# Patient Record
Sex: Male | Born: 2000 | Race: Black or African American | Hispanic: No | Marital: Single | Smoking: Never smoker
Health system: Southern US, Community
[De-identification: ages and names within clinical notes are randomized; demographics above are authoritative.]

## PROBLEM LIST (undated history)

## (undated) DIAGNOSIS — J45909 Unspecified asthma, uncomplicated: Secondary | ICD-10-CM

## (undated) HISTORY — PX: TONSILLECTOMY: SUR1361

## (undated) HISTORY — PX: MYRINGOTOMY WITH TUBE PLACEMENT: SHX5663

---
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
## 2021-09-11 ENCOUNTER — Other Ambulatory Visit: Payer: Self-pay

## 2021-09-11 ENCOUNTER — Emergency Department
Admission: EM | Admit: 2021-09-11 | Discharge: 2021-09-11 | Disposition: A | Discharge status: Home / Self Care | Payer: Medicaid Other | Attending: Emergency Medicine | Admitting: Emergency Medicine

## 2021-09-11 ENCOUNTER — Encounter: Payer: Self-pay | Admitting: Emergency Medicine

## 2021-09-11 ENCOUNTER — Emergency Department: Payer: Medicaid Other

## 2021-09-11 DIAGNOSIS — J4531 Mild persistent asthma with (acute) exacerbation: Secondary | ICD-10-CM | POA: Insufficient documentation

## 2021-09-11 DIAGNOSIS — R079 Chest pain, unspecified: Secondary | ICD-10-CM

## 2021-09-11 DIAGNOSIS — J4521 Mild intermittent asthma with (acute) exacerbation: Secondary | ICD-10-CM

## 2021-09-11 DIAGNOSIS — Z20822 Contact with and (suspected) exposure to covid-19: Secondary | ICD-10-CM | POA: Diagnosis not present

## 2021-09-11 DIAGNOSIS — R0789 Other chest pain: Secondary | ICD-10-CM | POA: Diagnosis present

## 2021-09-11 LAB — CBC WITH DIFFERENTIAL/PLATELET
Abs Immature Granulocytes: 0.04 10*3/uL (ref 0.00–0.07)
Basophils Absolute: 0 10*3/uL (ref 0.0–0.1)
Basophils Relative: 0 %
Eosinophils Absolute: 0.2 10*3/uL (ref 0.0–0.5)
Eosinophils Relative: 2 %
HCT: 44.6 % (ref 39.0–52.0)
Hemoglobin: 14.1 g/dL (ref 13.0–17.0)
Immature Granulocytes: 0 %
Lymphocytes Relative: 37 %
Lymphs Abs: 4.8 10*3/uL — ABNORMAL HIGH (ref 0.7–4.0)
MCH: 26.3 pg (ref 26.0–34.0)
MCHC: 31.6 g/dL (ref 30.0–36.0)
MCV: 83.1 fL (ref 80.0–100.0)
Monocytes Absolute: 0.7 10*3/uL (ref 0.1–1.0)
Monocytes Relative: 6 %
Neutro Abs: 7.1 10*3/uL (ref 1.7–7.7)
Neutrophils Relative %: 55 %
Platelets: 248 10*3/uL (ref 150–400)
RBC: 5.37 MIL/uL (ref 4.22–5.81)
RDW: 14.4 % (ref 11.5–15.5)
WBC: 12.9 10*3/uL — ABNORMAL HIGH (ref 4.0–10.5)
nRBC: 0.2 % (ref 0.0–0.2)

## 2021-09-11 LAB — COMPREHENSIVE METABOLIC PANEL
ALT: 27 U/L (ref 0–44)
AST: 25 U/L (ref 15–41)
Albumin: 4.4 g/dL (ref 3.5–5.0)
Alkaline Phosphatase: 120 U/L (ref 38–126)
Anion gap: 5 (ref 5–15)
BUN: 13 mg/dL (ref 6–20)
CO2: 28 mmol/L (ref 22–32)
Calcium: 9 mg/dL (ref 8.9–10.3)
Chloride: 109 mmol/L (ref 98–111)
Creatinine, Ser: 0.94 mg/dL (ref 0.61–1.24)
GFR, Estimated: >60 mL/min (ref >60)
Glucose, Bld: 112 mg/dL — ABNORMAL HIGH (ref 70–99)
Potassium: 3.6 mmol/L (ref 3.5–5.1)
Sodium: 142 mmol/L (ref 135–145)
Total Bilirubin: 0.6 mg/dL (ref 0.3–1.2)
Total Protein: 7.1 g/dL (ref 6.5–8.1)

## 2021-09-11 LAB — TROPONIN I (HIGH SENSITIVITY)
Troponin I (High Sensitivity): 5 ng/L (ref <18)
Troponin I (High Sensitivity): 4 ng/L (ref <18)

## 2021-09-11 LAB — RESP PANEL BY RT-PCR (FLU A&B, COVID) ARPGX2
Influenza A by PCR: NEGATIVE
Influenza B by PCR: NEGATIVE
SARS Coronavirus 2 by RT PCR: NEGATIVE

## 2021-09-11 LAB — D-DIMER, QUANTITATIVE: D-Dimer, Quant: 0.65 ug/mL-FEU — ABNORMAL HIGH (ref 0.00–0.50)

## 2021-09-11 MED ORDER — PREDNISONE 20 MG PO TABS: ORAL_TABLET | ORAL | 0 refills | Status: DC

## 2021-09-11 MED ORDER — IOHEXOL 350 MG/ML SOLN
75.0000 mL | Freq: Once | INTRAVENOUS | Status: AC | PRN
  Administered 2021-09-11: 75 mL via INTRAVENOUS

## 2021-09-11 MED ORDER — ALBUTEROL SULFATE HFA 108 (90 BASE) MCG/ACT IN AERS: 2.0000 | INHALATION_SPRAY | RESPIRATORY_TRACT | 2 refills | Status: DC | PRN

## 2021-09-11 MED ORDER — PREDNISONE 20 MG PO TABS
60.0000 mg | ORAL_TABLET | Freq: Once | ORAL | Status: AC
  Administered 2021-09-11: 60 mg via ORAL
  Filled 2021-09-11: qty 3

## 2021-09-11 MED ORDER — SODIUM CHLORIDE 0.9 % IV BOLUS
500.0000 mL | Freq: Once | INTRAVENOUS | Status: AC
  Administered 2021-09-11: 500 mL via INTRAVENOUS

## 2021-09-11 MED ORDER — IPRATROPIUM-ALBUTEROL 0.5-2.5 (3) MG/3ML IN SOLN
3.0000 mL | Freq: Once | RESPIRATORY_TRACT | Status: AC
  Administered 2021-09-11: 3 mL via RESPIRATORY_TRACT
  Filled 2021-09-11: qty 3

## 2021-09-11 NOTE — ED Triage Notes (Signed)
Patient ambulatory to triage with steady gait, without difficulty or distress noted; pt reports mid CP, nonradiating accomp by [facility]; denies any hx of same

## 2021-09-11 NOTE — Discharge Instructions (Signed)
1.  Take Prednisone 60mg  daily x4 days.  Take the next dose Thursday morning. 2.  Use your Albuterol inhaler every 4 hours as needed for difficulty breathing. 3.  Return to the ER for worsening symptoms, persistent vomiting, difficulty breathing or other concerns

## 2021-09-11 NOTE — ED Provider Notes (Addendum)
[facility] Emergency Department Provider Note   ____________________________________________   Event Date/Time   First MD Initiated Contact with Patient 09/11/21 [phone]     (approximate)  I have reviewed the triage vital signs and the nursing notes.   HISTORY  Chief Complaint Chest Pain    HPI Christopher Manning is a 20 y.o. male who presents to the ED from home with a chief complaint of mid chest tightness, nonradiating accompanied by shortness of breath.  Patient with a history of asthma; had cold-like symptoms last week.  This week has noted chest tightness with shortness of breath.  Denies fever, associated diaphoresis, palpitations, abdominal pain, nausea/vomiting or dizziness.  Trip to [place] back in July.  Denies recent trauma or hormone use.  Does admit to vaping.  Patient is vaccinated against COVID-19.     Past Medical History:  Diagnosis Date   Asthma     There are no problems to display for this patient.   Past Surgical History:  Procedure Laterality Date   MYRINGOTOMY WITH TUBE PLACEMENT     TONSILLECTOMY      Prior to Admission medications   Medication Sig Start Date End Date Taking? Authorizing Provider  predniSONE (DELTASONE) 20 MG tablet 3 tablets daily x 4 days 09/11/21  Yes Irean Hong, MD  Adapalene (DIFFERIN) 0.3 % gel Apply small amount to entire face every night. 04/16/20   Deirdre Evener, MD  albuterol (VENTOLIN HFA) 108 (90 Base) MCG/ACT inhaler Inhale 2 puffs into the lungs every 4 (four) hours as needed for wheezing or shortness of breath. 09/11/21   Irean Hong, MD  azithromycin (ZITHROMAX Z-PAK) 250 MG tablet 2 pills today then 1 pill a day for 4 days 01/31/19   Sherrie Mustache Roselyn Bering, PA-C  hydrocortisone 2.5 % lotion Apply topically 2 (two) times daily. 05/24/20   Deirdre Evener, MD    Allergies Patient has no known allergies.  No family history on file.  Social History Social History   Tobacco Use   Smoking  status: Never   Smokeless tobacco: Never  Vaping Use   Vaping Use: Every day  Substance Use Topics   Alcohol use: Never   Drug use: Never    Review of Systems  Constitutional: No fever/chills Eyes: No visual changes. ENT: No sore throat. Cardiovascular: Positive for chest pain. Respiratory: Positive for shortness of breath. Gastrointestinal: No abdominal pain.  No nausea, no vomiting.  No diarrhea.  No constipation. Genitourinary: Negative for dysuria. Musculoskeletal: Negative for back pain. Skin: Negative for rash. Neurological: Negative for headaches, focal weakness or numbness.   ____________________________________________   PHYSICAL EXAM:  VITAL SIGNS: ED Triage Vitals  Enc Vitals Group     BP 09/11/21 0321 (!) 173/110     Pulse Rate 09/11/21 0321 86     Resp 09/11/21 0321 16     Temp 09/11/21 0321 98.3 F (36.8 C)     Temp Source 09/11/21 0321 Oral     SpO2 09/11/21 0321 98 %     Weight 09/11/21 0318 (!) 310 lb (140.6 kg)     Height 09/11/21 0318 6' (1.829 m)     Head Circumference --      Peak Flow --      Pain Score 09/11/21 0318 4     Pain Loc --      Pain Edu? --      Excl. in GC? --     Constitutional: Alert and oriented.  Well appearing and in no acute distress. Eyes: Conjunctivae are normal. PERRL. EOMI. Head: Atraumatic. Nose: No congestion/rhinnorhea. Mouth/Throat: Mucous membranes are moist.   Neck: No stridor.   Cardiovascular: Normal rate, regular rhythm. Grossly normal heart sounds.  Good peripheral circulation. Respiratory: Normal respiratory effort.  No retractions. Lungs diminished bibasilarly. Gastrointestinal: Soft and nontender. No distention. No abdominal bruits. No CVA tenderness. Musculoskeletal: No lower extremity tenderness nor edema.  No joint effusions. Neurologic:  Normal speech and language. No gross focal neurologic deficits are appreciated. No gait instability. Skin:  Skin is warm, dry and intact. No rash  noted. Psychiatric: Mood and affect are normal. Speech and behavior are normal.  ____________________________________________   LABS (all labs ordered are listed, but only abnormal results are displayed)  Labs Reviewed  CBC WITH DIFFERENTIAL/PLATELET - Abnormal; Notable for the following components:      Result Value   WBC 12.9 (*)    Lymphs Abs 4.8 (*)    All other components within normal limits  COMPREHENSIVE METABOLIC PANEL - Abnormal; Notable for the following components:   Glucose, Bld 112 (*)    All other components within normal limits  D-DIMER, QUANTITATIVE - Abnormal; Notable for the following components:   D-Dimer, Quant 0.65 (*)    All other components within normal limits  RESP PANEL BY RT-PCR (FLU A&B, COVID) ARPGX2  TROPONIN I (HIGH SENSITIVITY)  TROPONIN I (HIGH SENSITIVITY)   ____________________________________________  EKG  ED ECG REPORT I, Branston Halsted J, the attending physician, personally viewed and interpreted this ECG.   Date: 09/11/2021  EKG Time: 0318  Rate: 83  Rhythm: normal sinus rhythm  Axis: Normal  Intervals:none  ST&T Change: Nonspecific  ____________________________________________  RADIOLOGY I, Llewellyn Schoenberger J, personally viewed and evaluated these images (plain radiographs) as part of my medical decision making, as well as reviewing the written report by the radiologist.  ED MD interpretation: No acute cardiopulmonary process; CTA negative for PE  Official radiology report(s): DG Chest 2 View  Result Date: 09/11/2021 CLINICAL DATA:  Mid chest pain. EXAM: CHEST - 2 VIEW COMPARISON:  February 02, 2019 FINDINGS: The heart size and mediastinal contours are within normal limits. Both lungs are clear. There is mild dextroscoliosis of the mid thoracic spine. IMPRESSION: No active cardiopulmonary disease. Electronically Signed   By: Aram Candela M.D.   On: 09/11/2021 03:45   CT Angio Chest PE W/Cm &/Or Wo Cm  Result Date:  09/11/2021 CLINICAL DATA:  20 year old male with chest pain. Shortness of breath. EXAM: CT ANGIOGRAPHY CHEST WITH CONTRAST TECHNIQUE: Multidetector CT imaging of the chest was performed using the standard protocol during bolus administration of intravenous contrast. Multiplanar CT image reconstructions and MIPs were obtained to evaluate the vascular anatomy. CONTRAST:  150 mL OMNIPAQUE IOHEXOL 350 MG/ML SOLN, COMPARISON:  Chest radiographs 0330 hours today. FINDINGS: Cardiovascular: Initially inadequate contrast bolus timing in the pulmonary arterial tree. Contrast injection was repeated at 0624 hours, and is better but unfortunately remains suboptimal (175 Hounsfield units in the main pulmonary artery). Mild respiratory motion on the 2nd attempt. There is no central or hilar pulmonary artery filling defect. But the bilateral segmental and distal branches are not well evaluated. Cardiac size within normal limits. No pericardial effusion. Negative visible aorta. Mediastinum/Nodes: Small volume residual thymus. No mediastinal mass or lymphadenopathy. Lungs/Pleura: Major airways are patent. Lung volumes are normal. Both lungs are clear. No pleural effusion. Upper Abdomen: Negative visible liver, spleen, pancreas, adrenal glands, kidneys and bowel in the upper abdomen. Musculoskeletal:  Negative. Review of the MIP images confirms the above findings. IMPRESSION: 1. Suboptimal contrast bolus timing despite two attempts. No central or hilar pulmonary embolus. Segmental and distal branches are not well evaluated. 2. Otherwise normal CT appearance of the Chest. Electronically Signed   By: Odessa Fleming M.D.   On: 09/11/2021 06:57    ____________________________________________   PROCEDURES  Procedure(s) performed (including Critical Care):  .1-3 Lead EKG Interpretation Performed by: Irean Hong, MD Authorized by: Irean Hong, MD     Interpretation: normal     ECG rate:  85   ECG rate assessment: normal     Rhythm:  sinus rhythm     Ectopy: none     Conduction: normal   Comments:     Patient placed on cardiac monitor to evaluate for arrhythmias   ____________________________________________   INITIAL IMPRESSION / ASSESSMENT AND PLAN / ED COURSE  As part of my medical decision making, I reviewed the following data within the electronic MEDICAL RECORD NUMBER History obtained from family, Nursing notes reviewed and incorporated, Labs reviewed, EKG interpreted, Old chart reviewed, Radiograph reviewed, and Notes from prior ED visits     20 year old male presenting with chest tightness and shortness of breath, history of asthma. Differential diagnosis includes, but is not limited to, ACS, aortic dissection, pulmonary embolism, cardiac tamponade, pneumothorax, pneumonia, pericarditis, myocarditis, GI-related causes including esophagitis/gastritis, and musculoskeletal chest wall pain.     Initial EKG and troponin unremarkable.  Will check repeat troponin, respiratory panel, D-dimer given [place] travel in July.  Will administer prednisone, DuoNeb and reassess.  Clinical Course as of 09/11/21 0700  Wed Sep 11, 2021  4235 Updated patient and mother on mildly elevated D-dimer; CTA chest ordered to evaluate for PE. [JS]  0603 Aeration improved.  Repeat troponin negative.  Awaiting CTA chest and results of respiratory panel. [JS]  [phone] Updated patient and mother on repeat troponin and negative respiratory panel.  CTA negative for PE.  Patient may be discharged home on prednisone and albuterol MDI refill.  Strict return precautions given.  Both verbalized understanding agree with plan of care.  [JS]    Clinical Course User Index [JS] Irean Hong, MD     ____________________________________________   FINAL CLINICAL IMPRESSION(S) / ED DIAGNOSES  Final diagnoses:  Nonspecific chest pain  Mild intermittent asthma with acute exacerbation     ED Discharge Orders          Ordered    predniSONE (DELTASONE) 20  MG tablet        09/11/21 0611    albuterol (VENTOLIN HFA) 108 (90 Base) MCG/ACT inhaler  Every 4 hours PRN        09/11/21 0655             Note:  This document was prepared using Dragon voice recognition software and may include unintentional dictation errors.    Irean Hong, MD 09/11/21 4315    Irean Hong, MD 09/11/21 [phone]

## 2021-09-12 ENCOUNTER — Other Ambulatory Visit: Payer: Self-pay

## 2021-09-12 ENCOUNTER — Encounter: Payer: Self-pay | Admitting: Emergency Medicine

## 2021-09-12 ENCOUNTER — Emergency Department: Payer: Medicaid Other

## 2021-09-12 DIAGNOSIS — R0602 Shortness of breath: Secondary | ICD-10-CM | POA: Insufficient documentation

## 2021-09-12 DIAGNOSIS — R0789 Other chest pain: Secondary | ICD-10-CM | POA: Insufficient documentation

## 2021-09-12 DIAGNOSIS — J45909 Unspecified asthma, uncomplicated: Secondary | ICD-10-CM | POA: Diagnosis not present

## 2021-09-12 DIAGNOSIS — D72829 Elevated white blood cell count, unspecified: Secondary | ICD-10-CM | POA: Insufficient documentation

## 2021-09-12 LAB — CBC WITH DIFFERENTIAL/PLATELET
Abs Immature Granulocytes: 0.16 10*3/uL — ABNORMAL HIGH (ref 0.00–0.07)
Basophils Absolute: 0 10*3/uL (ref 0.0–0.1)
Basophils Relative: 0 %
Eosinophils Absolute: 0 10*3/uL (ref 0.0–0.5)
Eosinophils Relative: 0 %
HCT: 45.6 % (ref 39.0–52.0)
Hemoglobin: 15.1 g/dL (ref 13.0–17.0)
Immature Granulocytes: 1 %
Lymphocytes Relative: 12 %
Lymphs Abs: 2 10*3/uL (ref 0.7–4.0)
MCH: 26.7 pg (ref 26.0–34.0)
MCHC: 33.1 g/dL (ref 30.0–36.0)
MCV: 80.7 fL (ref 80.0–100.0)
Monocytes Absolute: 0.3 10*3/uL (ref 0.1–1.0)
Monocytes Relative: 2 %
Neutro Abs: 14.8 10*3/uL — ABNORMAL HIGH (ref 1.7–7.7)
Neutrophils Relative %: 85 %
Platelets: 278 10*3/uL (ref 150–400)
RBC: 5.65 MIL/uL (ref 4.22–5.81)
RDW: 14.4 % (ref 11.5–15.5)
WBC: 17.2 10*3/uL — ABNORMAL HIGH (ref 4.0–10.5)
nRBC: 0 % (ref 0.0–0.2)

## 2021-09-12 LAB — COMPREHENSIVE METABOLIC PANEL
ALT: 28 U/L (ref 0–44)
AST: 23 U/L (ref 15–41)
Albumin: 5.1 g/dL — ABNORMAL HIGH (ref 3.5–5.0)
Alkaline Phosphatase: 124 U/L (ref 38–126)
Anion gap: 11 (ref 5–15)
BUN: 13 mg/dL (ref 6–20)
CO2: 25 mmol/L (ref 22–32)
Calcium: 9.8 mg/dL (ref 8.9–10.3)
Chloride: 105 mmol/L (ref 98–111)
Creatinine, Ser: 0.8 mg/dL (ref 0.61–1.24)
GFR, Estimated: >60 mL/min (ref >60)
Glucose, Bld: 148 mg/dL — ABNORMAL HIGH (ref 70–99)
Potassium: 3.8 mmol/L (ref 3.5–5.1)
Sodium: 141 mmol/L (ref 135–145)
Total Bilirubin: 0.8 mg/dL (ref 0.3–1.2)
Total Protein: 8.4 g/dL — ABNORMAL HIGH (ref 6.5–8.1)

## 2021-09-12 LAB — MAGNESIUM: Magnesium: 1.8 mg/dL (ref 1.7–2.4)

## 2021-09-12 LAB — T4, FREE: Free T4: 0.78 ng/dL (ref 0.61–1.12)

## 2021-09-12 LAB — TROPONIN I (HIGH SENSITIVITY): Troponin I (High Sensitivity): 7 ng/L (ref <18)

## 2021-09-12 LAB — TSH: TSH: 0.421 u[IU]/mL (ref 0.350–4.500)

## 2021-09-12 NOTE — ED Notes (Signed)
Mother to registration desk states her son is turning gray and is dusky, pt assessed by this RN, oxygen sats 98% pt has albuterol inhaler in his hand, does not appear to be in respiratory distress at this time.  Advised mother and pt that there are no triage rooms available at this time and he would be the next one back to triage room.

## 2021-09-12 NOTE — ED Notes (Signed)
Pt's care discuss with Dr. Scotty Court, [facility] for bloowork and repeat chest X-ray

## 2021-09-12 NOTE — ED Triage Notes (Signed)
Pt to ED via POV with c/o generalized CP x 1 week. Pt seen for same yesterday, pt states symptoms no better. Pt also c/o SOB. Pt A&O x4, NAD noted at this time.

## 2021-09-13 ENCOUNTER — Emergency Department
Admission: EM | Admit: 2021-09-13 | Discharge: 2021-09-13 | Disposition: A | Discharge status: Home / Self Care | Payer: Medicaid Other | Attending: Emergency Medicine | Admitting: Emergency Medicine

## 2021-09-13 DIAGNOSIS — R0602 Shortness of breath: Secondary | ICD-10-CM

## 2021-09-13 DIAGNOSIS — R0789 Other chest pain: Secondary | ICD-10-CM

## 2021-09-13 LAB — TROPONIN I (HIGH SENSITIVITY): Troponin I (High Sensitivity): 4 ng/L (ref <18)

## 2021-09-13 MED ORDER — KETOROLAC TROMETHAMINE 30 MG/ML IJ SOLN
60.0000 mg | Freq: Once | INTRAMUSCULAR | Status: AC
  Administered 2021-09-13: 60 mg via INTRAMUSCULAR
  Filled 2021-09-13: qty 2

## 2021-09-13 MED ORDER — HYDROCODONE-ACETAMINOPHEN 5-325 MG PO TABS
2.0000 | ORAL_TABLET | Freq: Once | ORAL | Status: AC
  Administered 2021-09-13: 2 via ORAL
  Filled 2021-09-13: qty 2

## 2021-09-13 MED ORDER — IBUPROFEN 800 MG PO TABS: 800.0000 mg | ORAL_TABLET | Freq: Three times a day (TID) | ORAL | 0 refills | Status: AC | PRN

## 2021-09-13 MED ORDER — ONDANSETRON 4 MG PO TBDP
4.0000 mg | ORAL_TABLET | Freq: Once | ORAL | Status: AC
  Administered 2021-09-13: 4 mg via ORAL
  Filled 2021-09-13: qty 1

## 2021-09-13 MED ORDER — IPRATROPIUM-ALBUTEROL 0.5-2.5 (3) MG/3ML IN SOLN
3.0000 mL | Freq: Once | RESPIRATORY_TRACT | Status: AC
  Administered 2021-09-13: 3 mL via RESPIRATORY_TRACT
  Filled 2021-09-13: qty 3

## 2021-09-13 NOTE — ED Notes (Signed)
Patient ambulated with pulse oximetry; HR 96-105 bpm; O2 Saturations 98-100% room air

## 2021-09-13 NOTE — ED Provider Notes (Addendum)
[facility] Emergency Department Provider Note  ____________________________________________   Event Date/Time   First MD Initiated Contact with Patient 09/13/21 0101     (approximate)  I have reviewed the triage vital signs and the nursing notes.   HISTORY  Chief Complaint Chest Pain    HPI Christopher Manning is a 20 y.o. male with history of obesity, asthma who presents to the emergency department with concerns for chest discomfort where he feels like something is sitting on his chest making it hard for him to breathe.  He was seen here in the emergency department yesterday for the same and had reassuring work-up and was discharged with albuterol inhaler and steroids.  States he has been taking both without much relief.  No fevers, cough.  States he did have symptoms of a cold a week ago that have resolved.  States pain is worse with deep inspiration and palpation.  No history of PE, DVT, exogenous estrogen use, recent fractures, surgery, trauma, hospitalization, prolonged travel or other immobilization. No lower extremity swelling or pain. No calf tenderness.  Mother is concerned because patient's father died suddenly of a cardiac arrhythmia at age 17 in [place].  No family history of premature CAD or sudden cardiac death.  He states that this feels similar to his asthma exacerbations.  Mother reports they have follow-up scheduled with his doctor in the morning at 8:15 AM.        Past Medical History:  Diagnosis Date   Asthma     There are no problems to display for this patient.   Past Surgical History:  Procedure Laterality Date   MYRINGOTOMY WITH TUBE PLACEMENT     TONSILLECTOMY      Prior to Admission medications   Medication Sig Start Date End Date Taking? Authorizing Provider  Adapalene (DIFFERIN) 0.3 % gel Apply small amount to entire face every night. 04/16/20   Deirdre Evener, MD  albuterol (VENTOLIN HFA) 108 (90 Base) MCG/ACT inhaler Inhale  2 puffs into the lungs every 4 (four) hours as needed for wheezing or shortness of breath. 09/11/21   Irean Hong, MD  azithromycin (ZITHROMAX Z-PAK) 250 MG tablet 2 pills today then 1 pill a day for 4 days 01/31/19   Sherrie Mustache Roselyn Bering, PA-C  hydrocortisone 2.5 % lotion Apply topically 2 (two) times daily. 05/24/20   Deirdre Evener, MD  predniSONE (DELTASONE) 20 MG tablet 3 tablets daily x 4 days 09/11/21   Irean Hong, MD    Allergies Patient has no known allergies.  History reviewed. No pertinent family history.  Social History Social History   Tobacco Use   Smoking status: Never   Smokeless tobacco: Never  Vaping Use   Vaping Use: Every day  Substance Use Topics   Alcohol use: Never   Drug use: Never    Review of Systems Constitutional: No fever. Eyes: No visual changes. ENT: No sore throat. Cardiovascular: + chest pain. Respiratory: + shortness of breath. Gastrointestinal: No nausea, vomiting, diarrhea. Genitourinary: Negative for dysuria. Musculoskeletal: Negative for back pain. Skin: Negative for rash. Neurological: Negative for focal weakness or numbness.  ____________________________________________   PHYSICAL EXAM:  VITAL SIGNS: ED Triage Vitals  Enc Vitals Group     BP 09/12/21 2118 (!) 157/105     Pulse Rate 09/12/21 2109 (!) 139     Resp 09/12/21 2118 (!) 22     Temp 09/12/21 2118 98.5 F (36.9 C)     Temp Source 09/12/21  2118 Oral     SpO2 09/12/21 2109 98 %     Weight --      Height --      Head Circumference --      Peak Flow --      Pain Score 09/12/21 2122 8     Pain Loc --      Pain Edu? --      Excl. in GC? --    CONSTITUTIONAL: Alert and oriented and responds appropriately to questions.  Afebrile, nontoxic, in no distress, obese HEAD: Normocephalic EYES: Conjunctivae clear, pupils appear equal, EOM appear intact ENT: normal nose; moist mucous membranes NECK: Supple, normal ROM CARD: RRR; S1 and S2 appreciated; no murmurs, no clicks,  no rubs, no gallops CHEST:  Chest wall is tender to palpation.  No crepitus, ecchymosis, erythema, warmth, rash or other lesions present.   RESP: Normal chest excursion without splinting or tachypnea; breath sounds clear and equal bilaterally; no wheezes, no rhonchi, no rales, no hypoxia or respiratory distress, speaking full sentences, good aeration diffusely ABD/GI: Normal bowel sounds; non-distended; soft, non-tender, no rebound, no guarding, no peritoneal signs, no hepatosplenomegaly BACK: The back appears normal EXT: Normal ROM in all joints; no deformity noted, no edema; no cyanosis, no calf tenderness or calf swelling SKIN: Normal color for age and race; warm; no rash on exposed skin NEURO: Moves all extremities equally PSYCH: Patient appears anxious.  Does not make good eye contact.  ____________________________________________   LABS (all labs ordered are listed, but only abnormal results are displayed)  Labs Reviewed  CBC WITH DIFFERENTIAL/PLATELET - Abnormal; Notable for the following components:      Result Value   WBC 17.2 (*)    Neutro Abs 14.8 (*)    Abs Immature Granulocytes 0.16 (*)    All other components within normal limits  COMPREHENSIVE METABOLIC PANEL - Abnormal; Notable for the following components:   Glucose, Bld 148 (*)    Total Protein 8.4 (*)    Albumin 5.1 (*)    All other components within normal limits  TSH  MAGNESIUM  T4, FREE  TROPONIN I (HIGH SENSITIVITY)  TROPONIN I (HIGH SENSITIVITY)   ____________________________________________  EKG   EKG Interpretation  Date/Time:  Thursday September 12 2021 21:19:54 EDT Ventricular Rate:  122 PR Interval:  126 QRS Duration: 76 QT Interval:  310 QTC Calculation: 441 R Axis:   48 Text Interpretation: Sinus tachycardia Nonspecific ST abnormality Abnormal ECG Confirmed by Rochele Raring [phone]) on 09/13/2021 1:11:28 AM        ____________________________________________  RADIOLOGY Christopher Manning,  personally viewed and evaluated these images (plain radiographs) as part of my medical decision making, as well as reviewing the written report by the radiologist.  ED MD interpretation: Chest x-ray clear.  Official radiology report(s): DG Chest 2 View  Result Date: 09/12/2021 CLINICAL DATA:  Chest pain for 1 week and shortness of breath EXAM: CHEST - 2 VIEW COMPARISON:  09/11/2021 FINDINGS: The heart size and mediastinal contours are within normal limits. Both lungs are clear. The visualized skeletal structures are unremarkable. IMPRESSION: No active cardiopulmonary disease. Electronically Signed   By: Alcide Clever M.D.   On: 09/12/2021 21:52    ____________________________________________   PROCEDURES  Procedure(s) performed (including Critical Care):  Procedures   ____________________________________________   INITIAL IMPRESSION / ASSESSMENT AND PLAN / ED COURSE  As part of my medical decision making, I reviewed the following data within the electronic MEDICAL RECORD NUMBER History obtained from  family, Nursing notes reviewed and incorporated, Labs reviewed , EKG interpreted , Old EKG reviewed, Radiograph reviewed , and Notes from prior ED visits       Patient here with very atypical chest pain.  Seems to be musculoskeletal in nature.  Patient and mother seem very anxious.  They were just seen here yesterday and had a reassuring work-up with normal cardiac labs and a CTA that showed no large PE.  Contrast bolus timing was not adequate but I have very low suspicion that he has a PE.  He has no risk factors for PE.  Troponin x2 yesterday and x2 today normal.  He was tachycardic but now his heart rate has improved and is normal in the 90s.  Chest x-ray clear.  No sign of infiltrate, edema, pneumothorax, rib fracture, cardiomegaly, pleural effusion, pericardial effusion on imaging today or yesterday.  He thinks that this is his asthma however he has no wheezing and has good aeration but he  thinks that a breathing treatment will help him feel better.  Will give a DuoNeb here.  We will also give IM Toradol as I think that this is musculoskeletal in nature.  He does have a little bit of a leukocytosis today but I suspect that this is secondary to being on steroids.  Mother seems very concerned because she reports that his father suddenly passed away at age 59 from a cardiac arrhythmia.  She cannot recall what arrhythmia he passed from.  Discussed with her that we can hook him to the cardiac monitor while he is here in the emergency department to monitor him closely which seems to reassure her.  We will also keep a close eye on his oxygen level at rest and with ambulation.  ED PROGRESS  Patient still complaining of chest discomfort and feeling like he cannot breathe however his oxygen level is 100% on room air at rest.  Again I think anxiety is playing a big role.  Attempted to reassure him and mother.  We will try to give him some Vicodin, Zofran to see if this helps with his discomfort and maybe helps him feel like he can take a deep breath.  I do not think that he has having an acute exam asthma exacerbation at this time.  3:30 AM  Pt reports feeling much better after Vicodin and Zofran.  We have not noticed any events on cardiac monitoring and his oxygen level has stayed normal.  Mother now thinks that from his recent URI that he may have musculoskeletal pain from coughing which is very likely.  Now that we are in front of his pain I think that Tylenol, Motrin should be sufficient to could bit under control at home.  We discussed if symptoms or not improving that he should follow-up with his PCP Dr. Laural Benes.  Patient and mother comfortable with this plan.   At this time, I do not feel there is any life-threatening condition present. I have reviewed, interpreted and discussed all results (EKG, imaging, lab, urine as appropriate) and exam findings with patient/family. I have reviewed nursing  notes and appropriate previous records.  I feel the patient is safe to be discharged home without further emergent workup and can continue workup as an outpatient as needed. Discussed usual and customary return precautions. Patient/family verbalize understanding and are comfortable with this plan.  Outpatient follow-up has been provided as needed. All questions have been answered.  ____________________________________________   FINAL CLINICAL IMPRESSION(S) / ED DIAGNOSES  Final  diagnoses:  Atypical chest pain  Shortness of breath     ED Discharge Orders     None       *Please note:  Christopher Manning was evaluated in Emergency Department on 09/13/2021 for the symptoms described in the history of present illness. He was evaluated in the context of the global COVID-19 pandemic, which necessitated consideration that the patient might be at risk for infection with the SARS-CoV-2 virus that causes COVID-19. Institutional protocols and algorithms that pertain to the evaluation of patients at risk for COVID-19 are in a state of rapid change based on information released by regulatory bodies including the CDC and federal and state organizations. These policies and algorithms were followed during the patient's care in the ED.  Some ED evaluations and interventions may be delayed as a result of limited staffing during and the pandemic.*   Note:  This document was prepared using Dragon voice recognition software and may include unintentional dictation errors.    Yandiel Bergum, Layla Maw, DO 09/13/21 0333    Jocob Dambach, Layla Maw, DO 09/13/21 [phone]

## 2021-09-13 NOTE — Discharge Instructions (Addendum)
You may alternate Tylenol 1000 mg every 6 hours as needed for pain, fever and Ibuprofen 800 mg every 8 hours as needed for pain, fever.  Please take Ibuprofen with food.  Do not take more than 4000 mg of Tylenol (acetaminophen) in a 24 hour period.  

## 2021-09-13 NOTE — ED Notes (Signed)
Nt accidentally checked off that the DG chest 2 View has been completed.  DG chest 2 view need to be reviewed.

## 2021-10-02 DIAGNOSIS — J4532 Mild persistent asthma with status asthmaticus: Secondary | ICD-10-CM | POA: Diagnosis not present

## 2022-04-21 ENCOUNTER — Encounter: Payer: Self-pay | Admitting: Radiology

## 2022-04-21 ENCOUNTER — Emergency Department: Payer: Medicaid Other

## 2022-04-21 DIAGNOSIS — J45909 Unspecified asthma, uncomplicated: Secondary | ICD-10-CM | POA: Insufficient documentation

## 2022-04-21 DIAGNOSIS — R0789 Other chest pain: Secondary | ICD-10-CM | POA: Insufficient documentation

## 2022-04-21 DIAGNOSIS — R0602 Shortness of breath: Secondary | ICD-10-CM | POA: Diagnosis not present

## 2022-04-21 LAB — BASIC METABOLIC PANEL
Anion gap: 8 (ref 5–15)
BUN: 8 mg/dL (ref 6–20)
CO2: 25 mmol/L (ref 22–32)
Calcium: 9 mg/dL (ref 8.9–10.3)
Chloride: 108 mmol/L (ref 98–111)
Creatinine, Ser: 0.78 mg/dL (ref 0.61–1.24)
GFR, Estimated: >60 mL/min (ref >60)
Glucose, Bld: 102 mg/dL — ABNORMAL HIGH (ref 70–99)
Potassium: 3.3 mmol/L — ABNORMAL LOW (ref 3.5–5.1)
Sodium: 141 mmol/L (ref 135–145)

## 2022-04-21 LAB — CBC
HCT: 44.9 % (ref 39.0–52.0)
Hemoglobin: 14 g/dL (ref 13.0–17.0)
MCH: 25.5 pg — ABNORMAL LOW (ref 26.0–34.0)
MCHC: 31.2 g/dL (ref 30.0–36.0)
MCV: 81.9 fL (ref 80.0–100.0)
Platelets: 219 10*3/uL (ref 150–400)
RBC: 5.48 MIL/uL (ref 4.22–5.81)
RDW: 14.2 % (ref 11.5–15.5)
WBC: 8.7 10*3/uL (ref 4.0–10.5)
nRBC: 0 % (ref 0.0–0.2)

## 2022-04-21 LAB — TROPONIN I (HIGH SENSITIVITY): Troponin I (High Sensitivity): 3 ng/L (ref <18)

## 2022-04-21 NOTE — ED Triage Notes (Signed)
Pt presents via POV c/o SOB and chest discomfort starting today. Reports possible anxiety or asthma per pt. Ambulatory to triage.  ?

## 2022-04-22 ENCOUNTER — Emergency Department
Admission: EM | Admit: 2022-04-22 | Discharge: 2022-04-22 | Disposition: A | Discharge status: Home / Self Care | Payer: Medicaid Other | Attending: Emergency Medicine | Admitting: Emergency Medicine

## 2022-04-22 DIAGNOSIS — R079 Chest pain, unspecified: Secondary | ICD-10-CM

## 2022-04-22 LAB — TROPONIN I (HIGH SENSITIVITY): Troponin I (High Sensitivity): 4 ng/L (ref <18)

## 2022-04-22 MED ORDER — KETOROLAC TROMETHAMINE 30 MG/ML IJ SOLN
60.0000 mg | Freq: Once | INTRAMUSCULAR | Status: AC
  Administered 2022-04-22: 60 mg via INTRAMUSCULAR
  Filled 2022-04-22: qty 2

## 2022-04-22 MED ORDER — LIDOCAINE VISCOUS HCL 2 % MT SOLN
15.0000 mL | Freq: Once | OROMUCOSAL | Status: AC
Start: 2022-04-22 — End: 2022-04-22
  Administered 2022-04-22: 15 mL via ORAL
  Filled 2022-04-22: qty 15

## 2022-04-22 MED ORDER — PANTOPRAZOLE SODIUM 40 MG PO TBEC
40.0000 mg | DELAYED_RELEASE_TABLET | Freq: Once | ORAL | Status: AC
  Administered 2022-04-22: 40 mg via ORAL
  Filled 2022-04-22: qty 1

## 2022-04-22 MED ORDER — LIDOCAINE VISCOUS HCL 2 % MT SOLN: 15.0000 mL | OROMUCOSAL | 0 refills | Status: AC | PRN

## 2022-04-22 MED ORDER — ALUM & MAG HYDROXIDE-SIMETH 200-200-20 MG/5ML PO SUSP
30.0000 mL | Freq: Once | ORAL | Status: AC
Start: 2022-04-22 — End: 2022-04-22
  Administered 2022-04-22: 30 mL via ORAL
  Filled 2022-04-22: qty 30

## 2022-04-22 MED ORDER — PANTOPRAZOLE SODIUM 40 MG PO TBEC: 40.0000 mg | DELAYED_RELEASE_TABLET | Freq: Every day | ORAL | 1 refills | Status: DC

## 2022-04-22 NOTE — Discharge Instructions (Addendum)
Please avoid NSAIDs such as aspirin (Goody powders), ibuprofen (Motrin, Advil), naproxen (Aleve) as these may worsen your symptoms.  Tylenol 1000 mg every 6 hours is safe to take as long as you have no history of liver problems (heavy alcohol use, cirrhosis, hepatitis).  Please avoid spicy, acidic (citrus fruits, tomato based sauces, salsa), greasy, fatty foods.  Please avoid caffeine and alcohol.  Smoking can also make GERD/acid reflux worse.  Over the counter medications such as TUMS, Maalox or Mylanta, pepcid, Prilosec or Nexium may help with your symptoms.  Do not take Prilosec or Nexium if you are already prescribed a proton pump inhibitor.  

## 2022-04-22 NOTE — ED Provider Notes (Signed)
? ?[facility] ?Provider Note ? ? ? Event Date/Time  ? First MD Initiated Contact with Patient 04/22/22 0226   ?  (approximate) ? ? ?History  ? ?Shortness of Breath ? ? ?HPI ? ?Christopher Manning is a 21 y.o. male with history of obesity who presents to the emergency department with complaints of upper chest pressure and shortness of breath that has been ongoing intermittently for months.  Has been seen in the emergency department several times for the same.  Has had negative cardiac work-ups here.  Did have a positive D-dimer and a CT of his chest in October 2022.  CT of the chest unfortunately had suboptimal contrast timing and could not rule out peripheral PE but there was no central embolus.  No history of PE, DVT, exogenous estrogen use, recent fractures, surgery, trauma, hospitalization, prolonged travel or other immobilization. No lower extremity swelling or pain. No calf tenderness.  He denies any aggravating or alleviating factors.  No nausea, vomiting, diaphoresis or dizziness.  No fever or cough.  Sees a pulmonologist for his asthma but has never seen a cardiologist for this or primary care doctor. ? ? ? ? ? ?History provided by patient. ? ? ? ?Past Medical History:  ?Diagnosis Date  ? Asthma   ? ? ?Past Surgical History:  ?Procedure Laterality Date  ? MYRINGOTOMY WITH TUBE PLACEMENT    ? TONSILLECTOMY    ? ? ?MEDICATIONS:  ?Prior to Admission medications   ?Medication Sig Start Date End Date Taking? Authorizing Provider  ?Adapalene (DIFFERIN) 0.3 % gel Apply small amount to entire face every night. 04/16/20   Deirdre Evener, MD  ?albuterol (VENTOLIN HFA) 108 (90 Base) MCG/ACT inhaler Inhale 2 puffs into the lungs every 4 (four) hours as needed for wheezing or shortness of breath. 09/11/21   Irean Hong, MD  ?azithromycin (ZITHROMAX Z-PAK) 250 MG tablet 2 pills today then 1 pill a day for 4 days 01/31/19   Faythe Ghee, PA-C  ?hydrocortisone 2.5 % lotion Apply topically 2 (two)  times daily. 05/24/20   Deirdre Evener, MD  ?ibuprofen (ADVIL) 800 MG tablet Take 1 tablet (800 mg total) by mouth every 8 (eight) hours as needed for mild pain. 09/13/21   Seham Gardenhire, Layla Maw, DO  ?predniSONE (DELTASONE) 20 MG tablet 3 tablets daily x 4 days 09/11/21   Irean Hong, MD  ? ? ?Physical Exam  ? ?Triage Vital Signs: ?ED Triage Vitals  ?Enc Vitals Group  ?   BP 04/21/22 2250 (!) 156/105  ?   Pulse Rate 04/21/22 2250 79  ?   Resp 04/21/22 2250 14  ?   Temp 04/21/22 2250 98.8 ?F (37.1 ?C)  ?   Temp Source 04/21/22 2250 Oral  ?   SpO2 04/21/22 2250 95 %  ?   Weight --   ?   Height --   ?   Head Circumference --   ?   Peak Flow --   ?   Pain Score 04/21/22 2249 4  ?   Pain Loc --   ?   Pain Edu? --   ?   Excl. in GC? --   ? ? ?Most recent vital signs: ?Vitals:  ? 04/22/22 0144 04/22/22 0330  ?BP: (!) 146/85 (!) 150/80  ?Pulse: 73 (!) 57  ?Resp: 20 (!) 21  ?Temp: 98.3 ?F (36.8 ?C)   ?SpO2: 99% 100%  ? ? ?CONSTITUTIONAL: Alert and oriented and responds appropriately to questions.  Well-appearing; well-nourished ?HEAD: Normocephalic, atraumatic ?EYES: Conjunctivae clear, pupils appear equal, sclera nonicteric ?ENT: normal nose; moist mucous membranes ?NECK: Supple, normal ROM ?CARD: RRR; S1 and S2 appreciated; no murmurs, no clicks, no rubs, no gallops ?CHEST:  Chest wall is nontender to palpation.  No crepitus, ecchymosis, erythema, warmth, rash or other lesions present.   ?RESP: Normal chest excursion without splinting or tachypnea; breath sounds clear and equal bilaterally; no wheezes, no rhonchi, no rales, no hypoxia or respiratory distress, speaking full sentences ?ABD/GI: Normal bowel sounds; non-distended; soft, non-tender, no rebound, no guarding, no peritoneal signs ?BACK: The back appears normal ?EXT: Normal ROM in all joints; no deformity noted, no edema; no cyanosis, no calf tenderness or calf swelling ?SKIN: Normal color for age and race; warm; no rash on exposed skin ?NEURO: Moves all extremities  equally, normal speech ?PSYCH: The patient's mood and manner are appropriate. ? ? ?ED Results / Procedures / Treatments  ? ?LABS: ?(all labs ordered are listed, but only abnormal results are displayed) ?Labs Reviewed  ?CBC - Abnormal; Notable for the following components:  ?    Result Value  ? MCH 25.5 (*)   ? All other components within normal limits  ?BASIC METABOLIC PANEL - Abnormal; Notable for the following components:  ? Potassium 3.3 (*)   ? Glucose, Bld 102 (*)   ? All other components within normal limits  ?TROPONIN I (HIGH SENSITIVITY)  ?TROPONIN I (HIGH SENSITIVITY)  ? ? ? ?EKG: ? EKG Interpretation ? ?Date/Time:  Monday Apr 21 2022 22:49:08 EDT ?Ventricular Rate:  84 ?PR Interval:  140 ?QRS Duration: 84 ?QT Interval:  350 ?QTC Calculation: 413 ?R Axis:   57 ?Text Interpretation: Normal sinus rhythm Normal ECG When compared with ECG of 12-Sep-2021 21:19, No significant change was found Confirmed by Rochele RaringWard, Lanecia Sliva [phone]([id number]) on 04/22/2022 2:25:55 AM ?  ? ?  ? ? ? ?RADIOLOGY: ?My personal review and interpretation of imaging: Chest x-ray clear. ? ?I have personally reviewed all radiology reports.   ?DG Chest 2 View ? ?Result Date: 04/21/2022 ?CLINICAL DATA:  Shortness of breath EXAM: CHEST - 2 VIEW COMPARISON:  09/12/2021 FINDINGS: Lungs are clear.  No pleural effusion or pneumothorax. The heart is normal in size. Visualized osseous structures are within normal limits. IMPRESSION: Normal chest radiographs. Electronically Signed   By: Charline BillsSriyesh  Krishnan M.D.   On: 04/21/2022 23:15   ? ? ?PROCEDURES: ? ?Critical Care performed: No ? ? ? ? ?Procedures ? ? ? ?IMPRESSION / MDM / ASSESSMENT AND PLAN / ED COURSE  ?I reviewed the triage vital signs and the nursing notes. ? ? ? ?Patient here with chest pain that has been ongoing for months. ? ?The patient is on the cardiac monitor to evaluate for evidence of arrhythmia and/or significant heart rate changes. ? ? ?DIFFERENTIAL DIAGNOSIS (includes but not limited to):   Chest  wall pain, esophagitis, esophageal spasm, costochondritis, less likely ACS, PE, dissection, pneumothorax, pneumonia, CHF ? ? ?PLAN: We will obtain CBC, BMP, troponin, chest x-ray, EKG.  I do not feel he needs a D-dimer or CTA of the chest as he is PERC negative.  Will give Toradol and reassess. ? ? ?MEDICATIONS GIVEN IN ED: ?Medications  ?ketorolac (TORADOL) 30 MG/ML injection 60 mg (60 mg Intramuscular Given 04/22/22 0318)  ?alum & mag hydroxide-simeth (MAALOX/MYLANTA) 200-200-20 MG/5ML suspension 30 mL (30 mLs Oral Given 04/22/22 0350)  ?  And  ?lidocaine (XYLOCAINE) 2 % viscous mouth solution 15 mL (15 mLs Oral  Given 04/22/22 0350)  ?pantoprazole (PROTONIX) EC tablet 40 mg (40 mg Oral Given 04/22/22 0350)  ? ? ? ?ED COURSE: Patient reports no improvement after Toradol.  Will give GI cocktail and Protonix.  Labs show no leukocytosis, normal hemoglobin.  Normal electrolytes.  Troponin x2 negative.  Chest x-ray reviewed and interpreted by myself and shows no infiltrate or edema.  No pneumothorax.  EKG shows right bundle branch block which is chronic for patient.  No new ischemic change. ? ?4:30 AM  Patient reports feeling much better after Protonix and GI cocktail.  I wonder if this is GI in nature could be esophagitis, esophageal spasm, GERD.  Abdominal exam is benign.  Discussed diet changes.  Will discharge with viscous lidocaine and Protonix and give GI outpatient follow-up.  He is comfortable with this plan. ? ? ?At this time, I do not feel there is any life-threatening condition present. I reviewed all nursing notes, vitals, pertinent previous records.  All lab and urine results, EKGs, imaging ordered have been independently reviewed and interpreted by myself.  I reviewed all available radiology reports from any imaging ordered this visit.  Based on my assessment, I feel the patient is safe to be discharged home without further emergent workup and can continue workup as an outpatient as needed. Discussed all  findings, treatment plan as well as usual and customary return precautions with patient.  They verbalize understanding and are comfortable with this plan.  Outpatient follow-up has been provided as needed.  All

## 2022-04-23 ENCOUNTER — Encounter: Payer: Self-pay | Admitting: Emergency Medicine

## 2022-04-23 ENCOUNTER — Other Ambulatory Visit: Payer: Self-pay

## 2022-04-23 DIAGNOSIS — F419 Anxiety disorder, unspecified: Secondary | ICD-10-CM | POA: Diagnosis not present

## 2022-04-23 DIAGNOSIS — J45901 Unspecified asthma with (acute) exacerbation: Secondary | ICD-10-CM | POA: Insufficient documentation

## 2022-04-23 DIAGNOSIS — R0602 Shortness of breath: Secondary | ICD-10-CM | POA: Diagnosis present

## 2022-04-23 NOTE — ED Triage Notes (Signed)
Pt to triage via w/c with no distress noted, brought in by EMS; pt reports [facility]; denies any recent illness, denies pain or other accomp symptoms; st seen on Monday with negative findings; has appt with pulmonologist on Friday to f/u for asthma; using nebs and inhaler without relief ?

## 2022-04-24 ENCOUNTER — Encounter: Payer: Self-pay | Admitting: Radiology

## 2022-04-24 ENCOUNTER — Emergency Department
Admission: EM | Admit: 2022-04-24 | Discharge: 2022-04-24 | Disposition: A | Discharge status: Home / Self Care | Payer: Medicaid Other | Attending: Emergency Medicine | Admitting: Emergency Medicine

## 2022-04-24 ENCOUNTER — Emergency Department: Payer: Medicaid Other

## 2022-04-24 DIAGNOSIS — J45901 Unspecified asthma with (acute) exacerbation: Secondary | ICD-10-CM

## 2022-04-24 MED ORDER — PREDNISONE 20 MG PO TABS: 60.0000 mg | ORAL_TABLET | Freq: Every day | ORAL | 0 refills | Status: AC

## 2022-04-24 MED ORDER — FLUTICASONE-SALMETEROL 250-50 MCG/ACT IN AEPB: 1.0000 | INHALATION_SPRAY | Freq: Two times a day (BID) | RESPIRATORY_TRACT | 1 refills | Status: AC

## 2022-04-24 MED ORDER — IPRATROPIUM-ALBUTEROL 0.5-2.5 (3) MG/3ML IN SOLN
3.0000 mL | Freq: Once | RESPIRATORY_TRACT | Status: AC
Start: 2022-04-24 — End: 2022-04-24
  Administered 2022-04-24: 3 mL via RESPIRATORY_TRACT
  Filled 2022-04-24: qty 3

## 2022-04-24 MED ORDER — ALBUTEROL SULFATE HFA 108 (90 BASE) MCG/ACT IN AERS: 2.0000 | INHALATION_SPRAY | Freq: Four times a day (QID) | RESPIRATORY_TRACT | 2 refills | Status: AC | PRN

## 2022-04-24 MED ORDER — IPRATROPIUM-ALBUTEROL 0.5-2.5 (3) MG/3ML IN SOLN
3.0000 mL | Freq: Once | RESPIRATORY_TRACT | Status: AC
  Administered 2022-04-24: 3 mL via RESPIRATORY_TRACT
  Filled 2022-04-24: qty 3

## 2022-04-24 MED ORDER — MONTELUKAST SODIUM 10 MG PO TABS: 10.0000 mg | ORAL_TABLET | Freq: Every day | ORAL | 2 refills | Status: AC

## 2022-04-24 MED ORDER — ALBUTEROL SULFATE HFA 108 (90 BASE) MCG/ACT IN AERS
2.0000 | INHALATION_SPRAY | RESPIRATORY_TRACT | Status: DC
  Administered 2022-04-24: 2 via RESPIRATORY_TRACT
  Filled 2022-04-24: qty 6.7

## 2022-04-24 MED ORDER — IOHEXOL 350 MG/ML SOLN
100.0000 mL | Freq: Once | INTRAVENOUS | Status: AC | PRN
  Administered 2022-04-24: 100 mL via INTRAVENOUS

## 2022-04-24 MED ORDER — PREDNISONE 20 MG PO TABS
60.0000 mg | ORAL_TABLET | Freq: Once | ORAL | Status: AC
  Administered 2022-04-24: 60 mg via ORAL
  Filled 2022-04-24: qty 3

## 2022-04-24 MED ORDER — PANTOPRAZOLE SODIUM 40 MG PO TBEC: 40.0000 mg | DELAYED_RELEASE_TABLET | Freq: Every day | ORAL | 1 refills | Status: AC

## 2022-04-24 NOTE — ED Provider Notes (Signed)
[facility] Provider Note    Event Date/Time   First MD Initiated Contact with Patient 04/24/22 0006     (approximate)   History   Asthma   HPI  Christopher Manning is a 21 y.o. male with a history of asthma and obesity who presents for evaluation of shortness of breath.  Patient reports that he has been having intermittent episodes of shortness of breath for the last several weeks.  Had one today at work.  He reports that when he feels like that he has heaviness/tightness of his chest, difficulty breathing and that makes him very anxious.  He has not seen his pulmonologist since October 2022.  He has run out of all of his medications at home including Singulair, Advair, and albuterol.  He reports that he vapes but has not done so in the last week because of these episodes of shortness of breath.  He denies pleuritic chest pain, cough, congestion, fever or chills.  Patient was seen here 2 days ago for the same complaints.  He was told that his shortness of breath was most likely from silent reflux.     Past Medical History:  Diagnosis Date   Asthma     Past Surgical History:  Procedure Laterality Date   MYRINGOTOMY WITH TUBE PLACEMENT     TONSILLECTOMY       Physical Exam   Triage Vital Signs: ED Triage Vitals  Enc Vitals Group     BP 04/23/22 2207 (!) 163/119     Pulse Rate 04/23/22 2207 88     Resp 04/23/22 2207 20     Temp 04/23/22 2207 98.8 F (37.1 C)     Temp src --      SpO2 04/23/22 2207 96 %     Weight 04/23/22 2207 (!) 350 lb (158.8 kg)     Height 04/23/22 2207 6' (1.829 m)     Head Circumference --      Peak Flow --      Pain Score 04/23/22 2206 0     Pain Loc --      Pain Edu? --      Excl. in GC? --     Most recent vital signs: Vitals:   04/23/22 2207 04/24/22 0213  BP: (!) 163/119 (!) 151/92  Pulse: 88 89  Resp: 20 18  Temp: 98.8 F (37.1 C)   SpO2: 96% 99%     Constitutional: Alert and oriented. Well appearing and  in no apparent distress. HEENT:      Head: Normocephalic and atraumatic.         Eyes: Conjunctivae are normal. Sclera is non-icteric.       Mouth/Throat: Mucous membranes are moist.       Neck: Supple with no signs of meningismus. Cardiovascular: Regular rate and rhythm. No murmurs, gallops, or rubs. 2+ symmetrical distal pulses are present in all extremities.  Respiratory: Normal respiratory effort. Lungs are clear to auscultation bilaterally.  Slightly decreased air movement bilateral Gastrointestinal: Soft, non tender, and non distended with positive bowel sounds. No rebound or guarding. Genitourinary: No CVA tenderness. Musculoskeletal:  No edema, cyanosis, or erythema of extremities. Neurologic: Normal speech and language. Face is symmetric. Moving all extremities. No gross focal neurologic deficits are appreciated. Skin: Skin is warm, dry and intact. No rash noted. Psychiatric: Mood and affect are normal. Speech and behavior are normal.  ED Results / Procedures / Treatments   Labs (all labs ordered are listed, but only  abnormal results are displayed) Labs Reviewed - No data to display   EKG  none   RADIOLOGY I, Nita Sicklearolina Coye Dawood, attending MD, have personally viewed and interpreted the images obtained during this visit as below:  CTA of the chest negative   ___________________________________________________ Interpretation by Radiologist:  CT Angio Chest PE W and/or Wo Contrast  Result Date: 04/24/2022 CLINICAL DATA:  Shortness of breath EXAM: CT ANGIOGRAPHY CHEST WITH CONTRAST TECHNIQUE: Multidetector CT imaging of the chest was performed using the standard protocol during bolus administration of intravenous contrast. Multiplanar CT image reconstructions and MIPs were obtained to evaluate the vascular anatomy. RADIATION DOSE REDUCTION: This exam was performed according to the departmental dose-optimization program which includes automated exposure control, adjustment of  the mA and/or kV according to patient size and/or use of iterative reconstruction technique. CONTRAST:  100mL OMNIPAQUE IOHEXOL 350 MG/ML SOLN COMPARISON:  Chest x-ray from 04/21/2022 FINDINGS: Cardiovascular: Thoracic aorta shows no aneurysmal dilatation or dissection. Heart is at the upper limits of normal in size. The pulmonary artery shows a normal branching pattern without definitive pulmonary emboli. Mediastinum/Nodes: Thoracic inlet is within normal limits. No sizable hilar or mediastinal adenopathy is noted. The esophagus is within normal limits. Lungs/Pleura: Lungs are well aerated without focal infiltrate or sizable effusion. No parenchymal nodules are noted. Upper Abdomen: Visualized upper abdomen is within normal limits. Musculoskeletal: No chest wall abnormality. No acute or significant osseous findings. Review of the MIP images confirms the above findings. IMPRESSION: No evidence of pulmonary emboli.  No acute abnormality noted. Electronically Signed   By: Alcide CleverMark  Lukens M.D.   On: 04/24/2022 01:09       PROCEDURES:  Critical Care performed: No  Procedures    IMPRESSION / MDM / ASSESSMENT AND PLAN / ED COURSE  I reviewed the triage vital signs and the nursing notes.  21 y.o. male with a history of asthma and obesity who presents for evaluation of shortness of breath.  Patient with intermittent episodes of chest tightness and shortness of breath for the last couple of weeks.  Seen here 2 days ago with a negative work-up including troponin x2, EKG, chest x-ray, and basic labs.  Felt better after GI cocktail and Pepcid.  Has an appointment with his pulmonologist tomorrow.  Patient was last seen by pulmonology in October 2022.  Has run out of all of his medications including albuterol, Advair, Singulair.  On exam is well-appearing in no distress with normal work of breathing normal sats, slightly diminished air movement bilaterally with no wheezing or crackles.  Looks euvolemic.  There is no  asymmetric leg swelling.  Ddx: Asthma exacerbation versus anxiety.  Low suspicion for PE however due to elevated BMI patient is slightly had an increased risk and since he was here 2 days ago with a negative work-up and no significant improvement of his symptoms I will go ahead and get a CTA specially since patient does not have any wheezing at this time.   MEDICATIONS GIVEN IN ED: Medications  albuterol (VENTOLIN HFA) 108 (90 Base) MCG/ACT inhaler 2 puff (2 puffs Inhalation Given 04/24/22 0212)  ipratropium-albuterol (DUONEB) 0.5-2.5 (3) MG/3ML nebulizer solution 3 mL (3 mLs Nebulization Given 04/24/22 0119)  ipratropium-albuterol (DUONEB) 0.5-2.5 (3) MG/3ML nebulizer solution 3 mL (3 mLs Nebulization Given 04/24/22 0119)  predniSONE (DELTASONE) tablet 60 mg (60 mg Oral Given 04/24/22 0119)  iohexol (OMNIPAQUE) 350 MG/ML injection 100 mL (100 mLs Intravenous Contrast Given 04/24/22 0055)     ED COURSE: CT is negative  for PE or pneumonia.  Chart from his visit 2 days ago was fully reviewed including EKG, chest x-ray and results of blood work.  His symptoms have been ongoing for several weeks therefore no need to repeat labs at this time.  With normal work of breathing, normal sats both at rest and ambulation I feel the patient is safe for discharge home.  My plan with him is to restart him on Advair, Singulair, rescue inhaler, put him on a course of prednisone for the next 5 days, and start him on Protonix for possible silent reflux.  I recommended that he keeps the appointment with his pulmonologist tomorrow for further evaluation.  If he continues not to do well with the above treatment, recommended follow-up with GI for outpatient evaluation of silent reflux.  We also discussed buying a pulse oximeter so when he has the symptoms at home he can check his oxygen level.  I recommended that he return to the ER for new or worsening chest pain, shortness of breath, or sats below 92%.  I feel the patient is  safe for discharge home at this time with strict return precautions and close outpatient follow-up   Consults: None   EMR reviewed including records from his last visit with his pulmonologist from October 2022    FINAL CLINICAL IMPRESSION(S) / ED DIAGNOSES   Final diagnoses:  Exacerbation of asthma, unspecified asthma severity, unspecified whether persistent     Rx / DC Orders   ED Discharge Orders          Ordered    fluticasone-salmeterol (ADVAIR DISKUS) 250-50 MCG/ACT AEPB  2 times daily        04/24/22 0151    montelukast (SINGULAIR) 10 MG tablet  Daily at bedtime        04/24/22 0151    albuterol (VENTOLIN HFA) 108 (90 Base) MCG/ACT inhaler  Every 6 hours PRN        04/24/22 0151    predniSONE (DELTASONE) 20 MG tablet  Daily with breakfast        04/24/22 0151    pantoprazole (PROTONIX) 40 MG tablet  Daily        04/24/22 0151             Note:  This document was prepared using Dragon voice recognition software and may include unintentional dictation errors.   Please note:  Patient was evaluated in Emergency Department today for the symptoms described in the history of present illness. Patient was evaluated in the context of the global COVID-19 pandemic, which necessitated consideration that the patient might be at risk for infection with the SARS-CoV-2 virus that causes COVID-19. Institutional protocols and algorithms that pertain to the evaluation of patients at risk for COVID-19 are in a state of rapid change based on information released by regulatory bodies including the CDC and federal and state organizations. These policies and algorithms were followed during the patient's care in the ED.  Some ED evaluations and interventions may be delayed as a result of limited staffing during the pandemic.       Don Perking, [place], MD 04/24/22 [phone]

## 2022-04-24 NOTE — Discharge Instructions (Addendum)
As we discussed, stop vaping, restart your Advair and Singulair every day as prescribed.  Use albuterol rescue inhaler as needed.  Take steroids once a day for the next 4 days.  Start taking Protonix on a daily basis.  Make sure to follow-up with your pulmonologist on Friday.  I recommend that you purchase a pulse oximeter to check your oxygen at home.  If you have new or worsening chest pain, shortness of breath, wheezing, or if your oxygen is less than 92% please come to the ER.

## 2022-04-25 ENCOUNTER — Emergency Department: Payer: Medicaid Other

## 2022-04-25 DIAGNOSIS — R0789 Other chest pain: Secondary | ICD-10-CM | POA: Diagnosis present

## 2022-04-25 DIAGNOSIS — J45909 Unspecified asthma, uncomplicated: Secondary | ICD-10-CM | POA: Diagnosis not present

## 2022-04-25 DIAGNOSIS — R55 Syncope and collapse: Secondary | ICD-10-CM | POA: Insufficient documentation

## 2022-04-25 DIAGNOSIS — R0602 Shortness of breath: Secondary | ICD-10-CM | POA: Diagnosis not present

## 2022-04-25 DIAGNOSIS — R002 Palpitations: Secondary | ICD-10-CM | POA: Diagnosis not present

## 2022-04-25 LAB — CBC WITH DIFFERENTIAL/PLATELET
Abs Immature Granulocytes: 0.03 10*3/uL (ref 0.00–0.07)
Basophils Absolute: 0 10*3/uL (ref 0.0–0.1)
Basophils Relative: 0 %
Eosinophils Absolute: 0.1 10*3/uL (ref 0.0–0.5)
Eosinophils Relative: 1 %
HCT: 44.2 % (ref 39.0–52.0)
Hemoglobin: 13.9 g/dL (ref 13.0–17.0)
Immature Granulocytes: 0 %
Lymphocytes Relative: 29 %
Lymphs Abs: 3.1 10*3/uL (ref 0.7–4.0)
MCH: 25.8 pg — ABNORMAL LOW (ref 26.0–34.0)
MCHC: 31.4 g/dL (ref 30.0–36.0)
MCV: 82 fL (ref 80.0–100.0)
Monocytes Absolute: 0.6 10*3/uL (ref 0.1–1.0)
Monocytes Relative: 6 %
Neutro Abs: 7.1 10*3/uL (ref 1.7–7.7)
Neutrophils Relative %: 64 %
Platelets: 239 10*3/uL (ref 150–400)
RBC: 5.39 MIL/uL (ref 4.22–5.81)
RDW: 14.6 % (ref 11.5–15.5)
WBC: 11 10*3/uL — ABNORMAL HIGH (ref 4.0–10.5)
nRBC: 0 % (ref 0.0–0.2)

## 2022-04-25 LAB — COMPREHENSIVE METABOLIC PANEL
ALT: 24 U/L (ref 0–44)
AST: 22 U/L (ref 15–41)
Albumin: 4.8 g/dL (ref 3.5–5.0)
Alkaline Phosphatase: 92 U/L (ref 38–126)
Anion gap: 9 (ref 5–15)
BUN: 14 mg/dL (ref 6–20)
CO2: 27 mmol/L (ref 22–32)
Calcium: 9.5 mg/dL (ref 8.9–10.3)
Chloride: 110 mmol/L (ref 98–111)
Creatinine, Ser: 0.95 mg/dL (ref 0.61–1.24)
GFR, Estimated: >60 mL/min (ref >60)
Glucose, Bld: 99 mg/dL (ref 70–99)
Potassium: 4 mmol/L (ref 3.5–5.1)
Sodium: 146 mmol/L — ABNORMAL HIGH (ref 135–145)
Total Bilirubin: 0.7 mg/dL (ref 0.3–1.2)
Total Protein: 7.9 g/dL (ref 6.5–8.1)

## 2022-04-25 LAB — TROPONIN I (HIGH SENSITIVITY): Troponin I (High Sensitivity): 3 ng/L (ref <18)

## 2022-04-26 ENCOUNTER — Emergency Department
Admission: EM | Admit: 2022-04-26 | Discharge: 2022-04-26 | Disposition: A | Discharge status: Home / Self Care | Payer: Medicaid Other | Attending: Emergency Medicine | Admitting: Emergency Medicine

## 2022-04-26 DIAGNOSIS — R002 Palpitations: Secondary | ICD-10-CM

## 2022-04-26 LAB — TROPONIN I (HIGH SENSITIVITY): Troponin I (High Sensitivity): 4 ng/L (ref <18)

## 2022-04-26 LAB — TSH: TSH: 1.838 u[IU]/mL (ref 0.350–4.500)

## 2022-04-26 LAB — T4, FREE: Free T4: 0.99 ng/dL (ref 0.61–1.12)

## 2022-04-26 NOTE — ED Provider Notes (Signed)
[facility] Provider Note    Event Date/Time   First MD Initiated Contact with Patient 04/26/22 0020     (approximate)   History   Chest Pain and Shortness of Breath   HPI  Christopher Manning is a 21 y.o. male with a history of asthma who presents for evaluation of near syncope.  This is patient's third visit to the ER in the last week for similar complaints.  Patient reports that for almost a year now he has had constant heaviness in his chest and shortness of breath.  Over the last week patient has had these episodes where he feels that his heart is pounding in his chest and then a sensation that goes up into his head and he feels lightheaded like he is going to pass out.  This is the third time that he has happened this week.  He reports that when he gets the heaviness in the pounding in his chest that he feels very anxious but he denies hyperventilating.  He does report being under a lot of stress and having some anxiety but he does not think this is the cause of his symptoms.  He has no wheezing, no chest tightness.  There is no personal or family history of sudden death or malignant dysrhythmias as far as he knows.  No history of PE or DVT.  He uses his rescue inhaler during these episodes with no significant relief.  He never fully passed out.  All 3 episodes happen in the end of the day after working a full shift.  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  None of the other employees have had similar symptoms.     Past Medical History:  Diagnosis Date   Asthma     Past Surgical History:  Procedure Laterality Date   MYRINGOTOMY WITH TUBE PLACEMENT     TONSILLECTOMY       Physical Exam   Triage Vital Signs: ED Triage Vitals  Enc Vitals Group     BP 04/25/22 2144 (!) 157/110     Pulse Rate 04/25/22 2144 82     Resp 04/25/22 2144 19     Temp 04/25/22 2144 98.4 F (36.9 C)     Temp Source 04/25/22 2144 Oral     SpO2 04/25/22 2144 98 %      Weight --      Height --      Head Circumference --      Peak Flow --      Pain Score 04/25/22 2145 7     Pain Loc --      Pain Edu? --      Excl. in GC? --     Most recent vital signs: Vitals:   04/25/22 2144 04/26/22 0044  BP: (!) 157/110 (!) 144/78  Pulse: 82 79  Resp: 19 20  Temp: 98.4 F (36.9 C)   SpO2: 98% 99%     Constitutional: Alert and oriented. Well appearing and in no apparent distress. HEENT:      Head: Normocephalic and atraumatic.         Eyes: Conjunctivae are normal. Sclera is non-icteric.       Mouth/Throat: Mucous membranes are moist.       Neck: Supple with no signs of meningismus. Cardiovascular: Regular rate and rhythm. No murmurs, gallops, or rubs. 2+ symmetrical distal pulses are present in all extremities.  Respiratory: Normal respiratory effort. Lungs are clear to auscultation bilaterally.  Gastrointestinal: Soft, non tender, and non distended with positive bowel sounds. No rebound or guarding. Genitourinary: No CVA tenderness. Musculoskeletal:  No edema, cyanosis, or erythema of extremities. Neurologic: Normal speech and language. Face is symmetric. Moving all extremities. No gross focal neurologic deficits are appreciated. Skin: Skin is warm, dry and intact. No rash noted. Psychiatric: Mood and affect are normal. Speech and behavior are normal.  ED Results / Procedures / Treatments   Labs (all labs ordered are listed, but only abnormal results are displayed) Labs Reviewed  CBC WITH DIFFERENTIAL/PLATELET - Abnormal; Notable for the following components:      Result Value   WBC 11.0 (*)    MCH 25.8 (*)    All other components within normal limits  COMPREHENSIVE METABOLIC PANEL - Abnormal; Notable for the following components:   Sodium 146 (*)    All other components within normal limits  TSH  T4, FREE  TROPONIN I (HIGH SENSITIVITY)  TROPONIN I (HIGH SENSITIVITY)     EKG  ED ECG REPORT I, Nita Sickle, the attending physician,  personally viewed and interpreted this ECG.  Sinus rhythm with a rate of 86, normal intervals, normal axis, no ST elevations or depressions.  Normal EKG.  RADIOLOGY I, Nita Sickle, attending MD, have personally viewed and interpreted the images obtained during this visit as below:  Chest x-ray negative   ___________________________________________________ Interpretation by Radiologist:  DG Chest Portable 1 View  Result Date: 04/25/2022 CLINICAL DATA:  Chest pain EXAM: PORTABLE CHEST 1 VIEW COMPARISON:  04/21/2022 FINDINGS: The heart size and mediastinal contours are within normal limits. Both lungs are clear. The visualized skeletal structures are unremarkable. IMPRESSION: No active disease. Electronically Signed   By: Alcide Clever M.D.   On: 04/25/2022 22:36       PROCEDURES:  Critical Care performed: No  Procedures    IMPRESSION / MDM / ASSESSMENT AND PLAN / ED COURSE  I reviewed the triage vital signs and the nursing notes.  21 y.o. male with a history of asthma who presents for evaluation of near syncope.  Patient's third visit to the ER in the last week for episodes where he feels his heart pounding in his chest and then feels lightheaded like he is going to pass out.  Upon arrival to the emergency room he reports that the sensation is now resolved.  His exam is unremarkable with regular rate and rhythm, no murmurs, normal vital signs.  Ddx: Dysrhythmia versus anxiety versus bronchospasm versus thyroid disease   Plan: EKG, troponin x2, chest x-ray, thyroid studies, CBC, BMP.  Will place patient on telemetry for monitoring for any signs of dysrhythmias.  Patient had a CT angio of the chest 4 days ago for similar complaints and that was negative for any acute findings.   MEDICATIONS GIVEN IN ED: Medications - No data to display   ED COURSE: EKG is within normal limits.  2 troponins negative.  Thyroid studies are normal.  No signs of anemia, dehydration, or  significant electrolyte derangements.  Chest x-ray is negative.  Patient was monitored for 2 hours on telemetry with no signs of dysrhythmias.  Will refer patient to cardiology for further evaluation of palpitations and GI for possible reflux as the etiology of his symptoms.  We discussed again the importance of taking Protonix and buying a pulse oximeter that can help reassure him when he has these episodes.  We discussed standard return precautions and close follow-up.  All questions that he and his mother  had were answered.  Patient remains well-appearing on exam.   Consults: None   EMR reviewed including records from his last visit with his pulmonologist from yesterday    FINAL CLINICAL IMPRESSION(S) / ED DIAGNOSES   Final diagnoses:  Palpitations     Rx / DC Orders   ED Discharge Orders          Ordered    Ambulatory referral to Cardiology       Comments: palpitations   04/26/22 0248    Ambulatory referral to Gastroenterology       Comments: Chest pain concerning for GERD   04/26/22 0248             Note:  This document was prepared using Dragon voice recognition software and may include unintentional dictation errors.   Please note:  Patient was evaluated in Emergency Department today for the symptoms described in the history of present illness. Patient was evaluated in the context of the global COVID-19 pandemic, which necessitated consideration that the patient might be at risk for infection with the SARS-CoV-2 virus that causes COVID-19. Institutional protocols and algorithms that pertain to the evaluation of patients at risk for COVID-19 are in a state of rapid change based on information released by regulatory bodies including the CDC and federal and state organizations. These policies and algorithms were followed during the patient's care in the ED.  Some ED evaluations and interventions may be delayed as a result of limited staffing during the pandemic.        Don Perking, [place], MD 04/26/22 [phone]

## 2022-04-26 NOTE — ED Notes (Signed)
Discharge instructions dicussed with pt and parent, mother. Pt verbalized understanding with no questions at this time. Pt to follow up with cardiology/gastroenterology.

## 2022-04-26 NOTE — ED Notes (Signed)
Pt to ED for CP and SOB, pt has hx of asthma. Pt states he has been in the ER the past week with same issue, this time it "feels more closer to passing out". Pt has Central Chest tightness that started Monday.  Pt took prescribed albuterol PTA, with no relief.

## 2022-06-12 ENCOUNTER — Ambulatory Visit: Payer: Medicaid Other | Admitting: Cardiology

## 2022-08-04 ENCOUNTER — Encounter: Payer: Self-pay | Admitting: Cardiology

## 2022-08-04 ENCOUNTER — Ambulatory Visit: Payer: Medicaid Other | Admitting: Cardiology

## 2022-09-18 ENCOUNTER — Ambulatory Visit: Payer: Medicaid Other | Admitting: Gastroenterology

## 2022-09-18 ENCOUNTER — Other Ambulatory Visit: Payer: Self-pay

## 2023-03-31 IMAGING — CT CT ANGIO CHEST
2 of 6 series · 18 of 46 positions shown · IV contrast (APPLIED)
Comparison: Chest x-ray from 04/21/2022

CLINICAL DATA: Shortness of breath

EXAM:
CT ANGIOGRAPHY CHEST WITH CONTRAST
TECHNIQUE: Multidetector CT imaging of the chest was performed using the
standard protocol during bolus administration of intravenous
contrast. Multiplanar CT image reconstructions and MIPs were
obtained to evaluate the vascular anatomy.

[Series 6: thins · axial · 0.74mm/px · z∈[-585,-341]mm · 15 of 382 slices shown]
[im 17/382  lung]
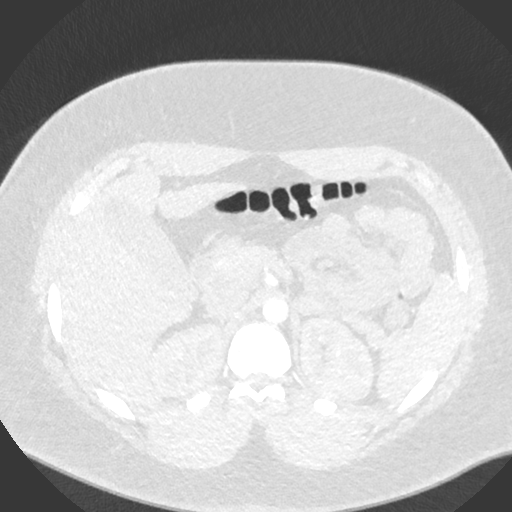
[im 50/382  soft-tissue]
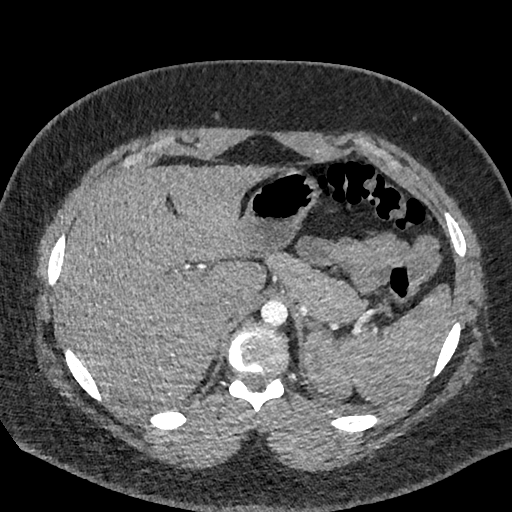
[im 67/382  lung]
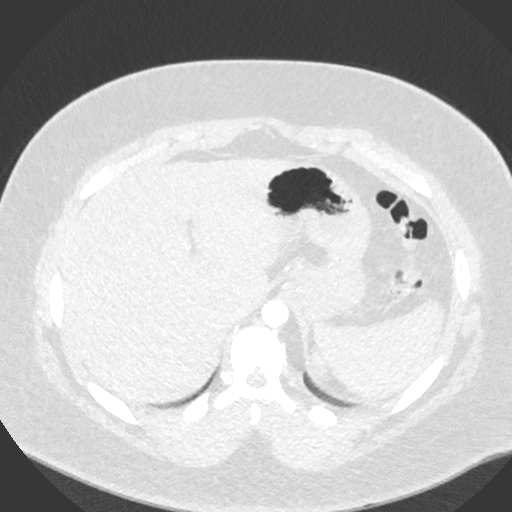
[im 100/382  soft-tissue]
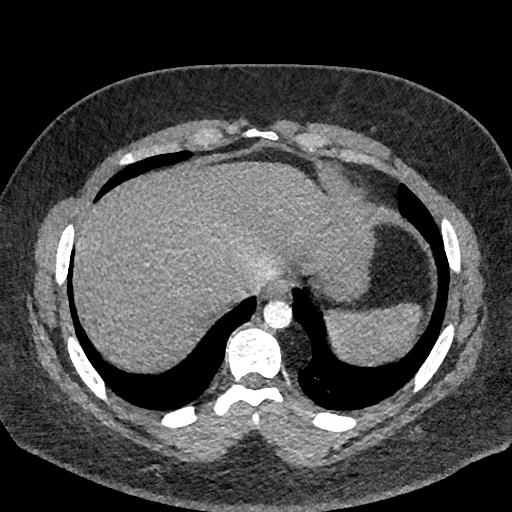
[im 116/382  lung]
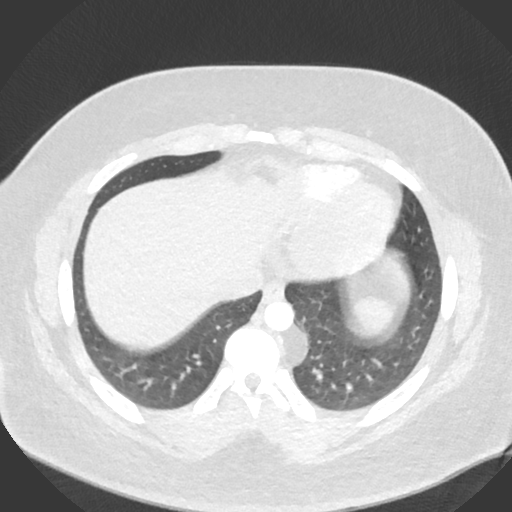
[im 150/382  soft-tissue]
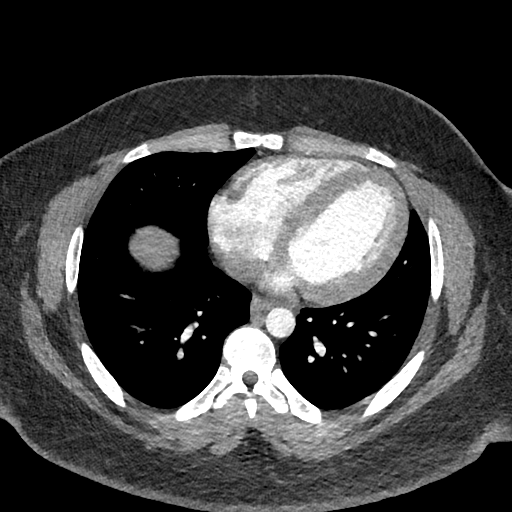
[im 166/382  lung]
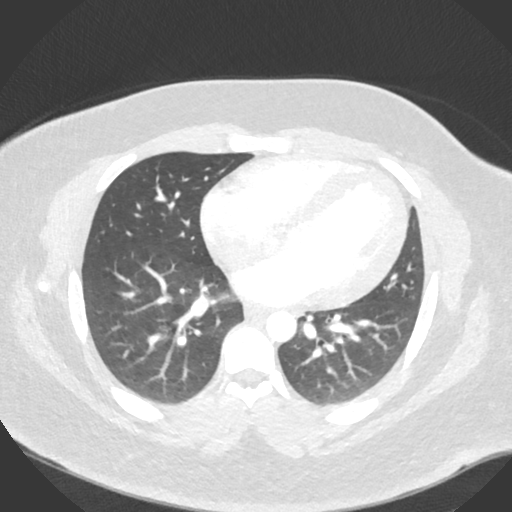
[im 199/382  soft-tissue]
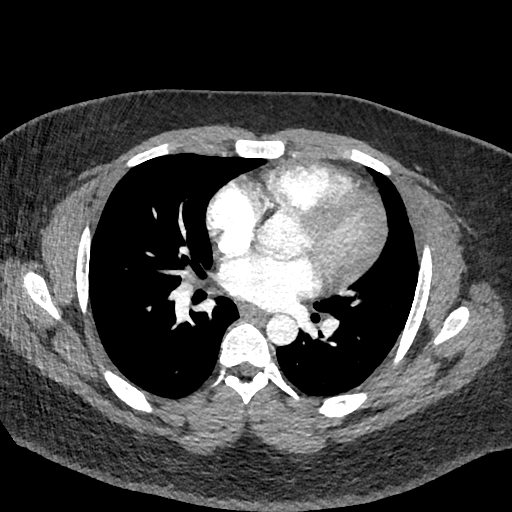
[im 216/382  lung]
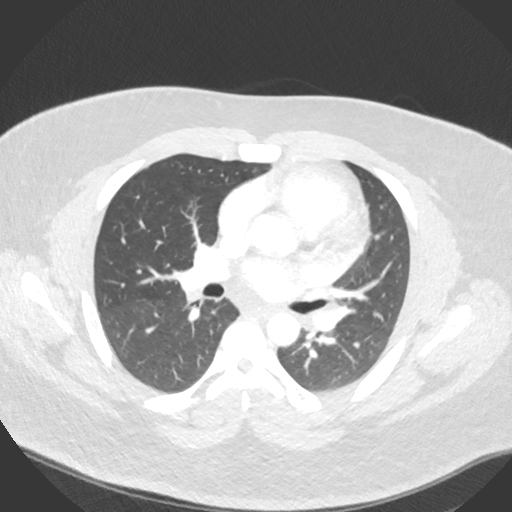
[im 232/382  soft-tissue]
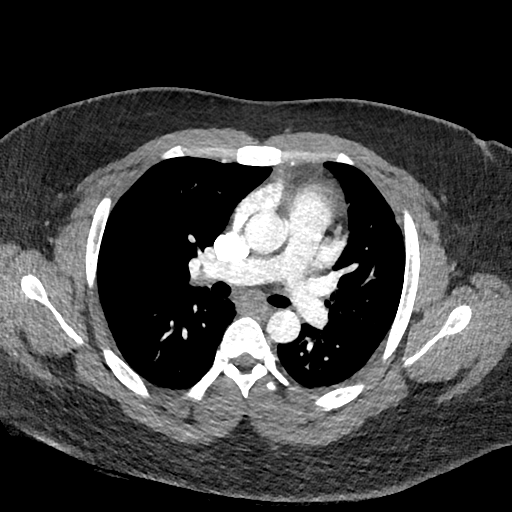
[im 266/382  lung]
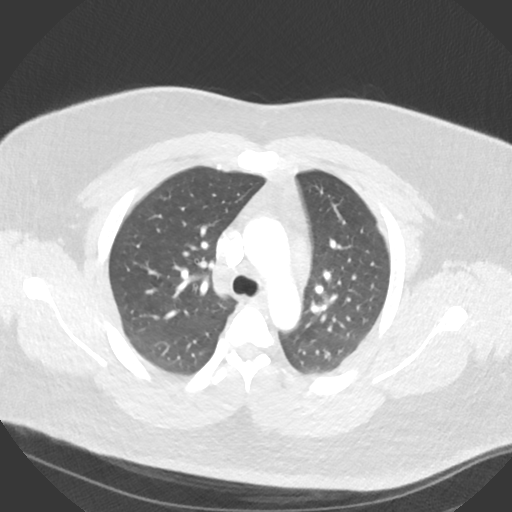
[im 282/382  soft-tissue]
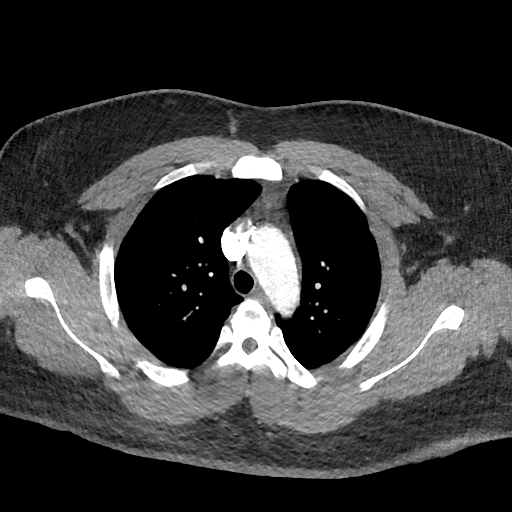
[im 315/382  lung]
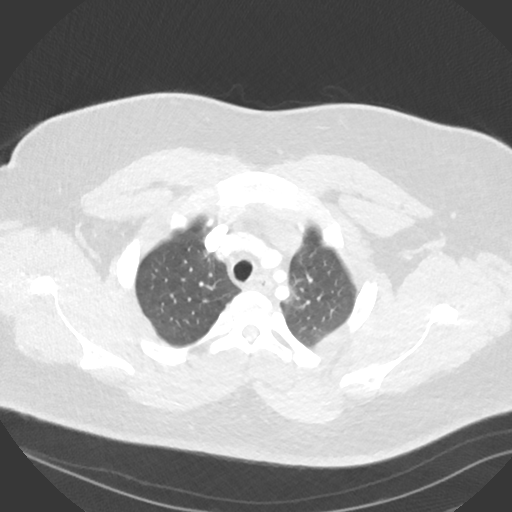
[im 332/382  soft-tissue]
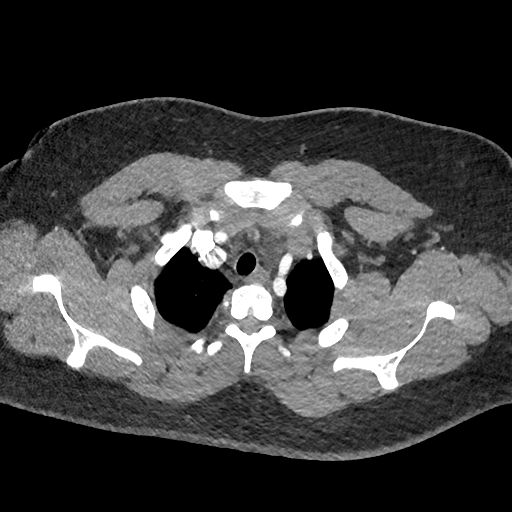
[im 365/382  lung]
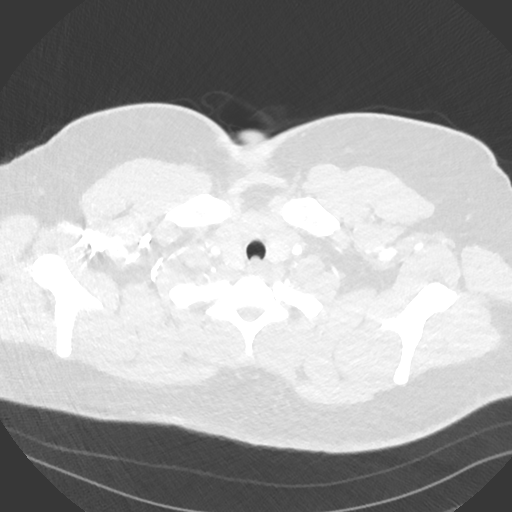

[Series 7: cor · coronal · 0.54mm/px · 3 of 138 slices shown]
[im 35/138  soft-tissue]
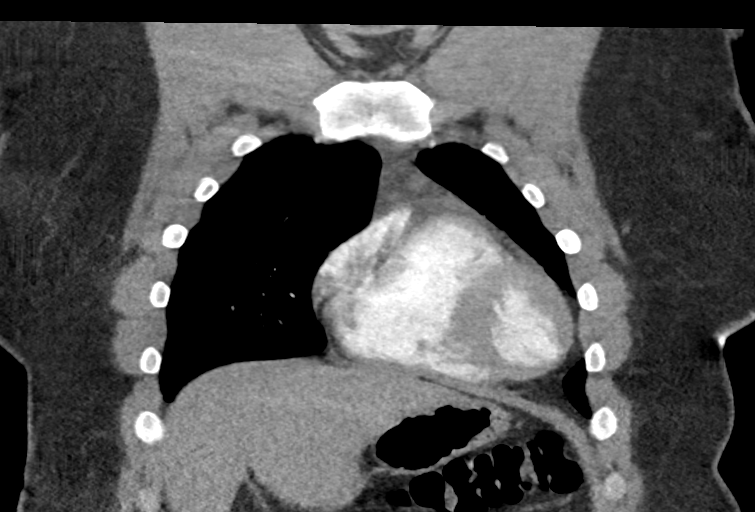
[im 69/138  soft-tissue]
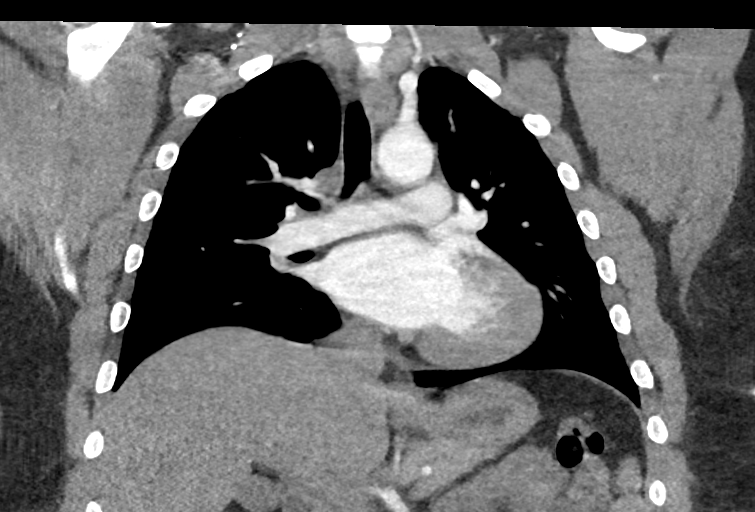
[im 103/138  soft-tissue]
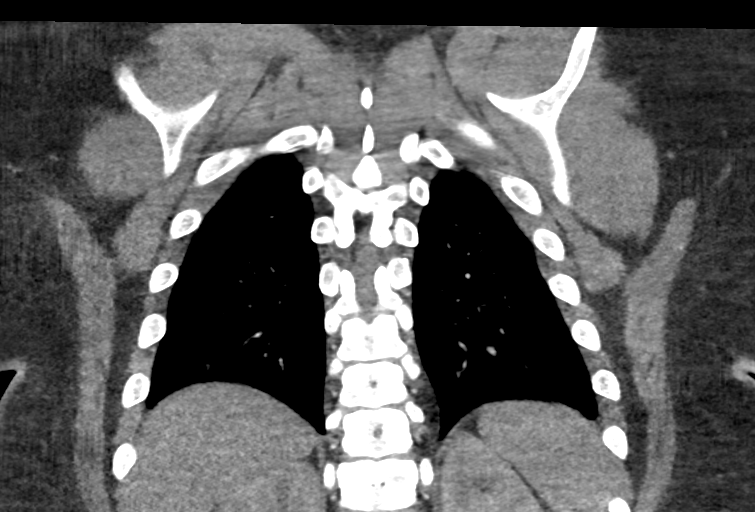

[18 of 46 positions shown; findings below may reference images not displayed]

RADIATION DOSE REDUCTION: This exam was performed according to the
departmental dose-optimization program which includes automated
exposure control, adjustment of the mA and/or kV according to
patient size and/or use of iterative reconstruction technique.

CONTRAST:  100mL OMNIPAQUE IOHEXOL 350 MG/ML SOLN
FINDINGS: Cardiovascular: Thoracic aorta shows no aneurysmal dilatation or
dissection. Heart is at the upper limits of normal in size. The
pulmonary artery shows a normal branching pattern without definitive
pulmonary emboli.

Mediastinum/Nodes: Thoracic inlet is within normal limits. No
sizable hilar or mediastinal adenopathy is noted. The esophagus is
within normal limits.

Lungs/Pleura: Lungs are well aerated without focal infiltrate or
sizable effusion. No parenchymal nodules are noted.

Upper Abdomen: Visualized upper abdomen is within normal limits.

Musculoskeletal: No chest wall abnormality. No acute or significant
osseous findings.

Review of the MIP images confirms the above findings.
IMPRESSION: No evidence of pulmonary emboli.  No acute abnormality noted.

## 2023-04-01 IMAGING — DX DG CHEST 1V PORT
1 series · 1 of 1 positions shown · non-contrast
Comparison: 04/21/2022

CLINICAL DATA: Chest pain

EXAM:
PORTABLE CHEST 1 VIEW

[chest ap]
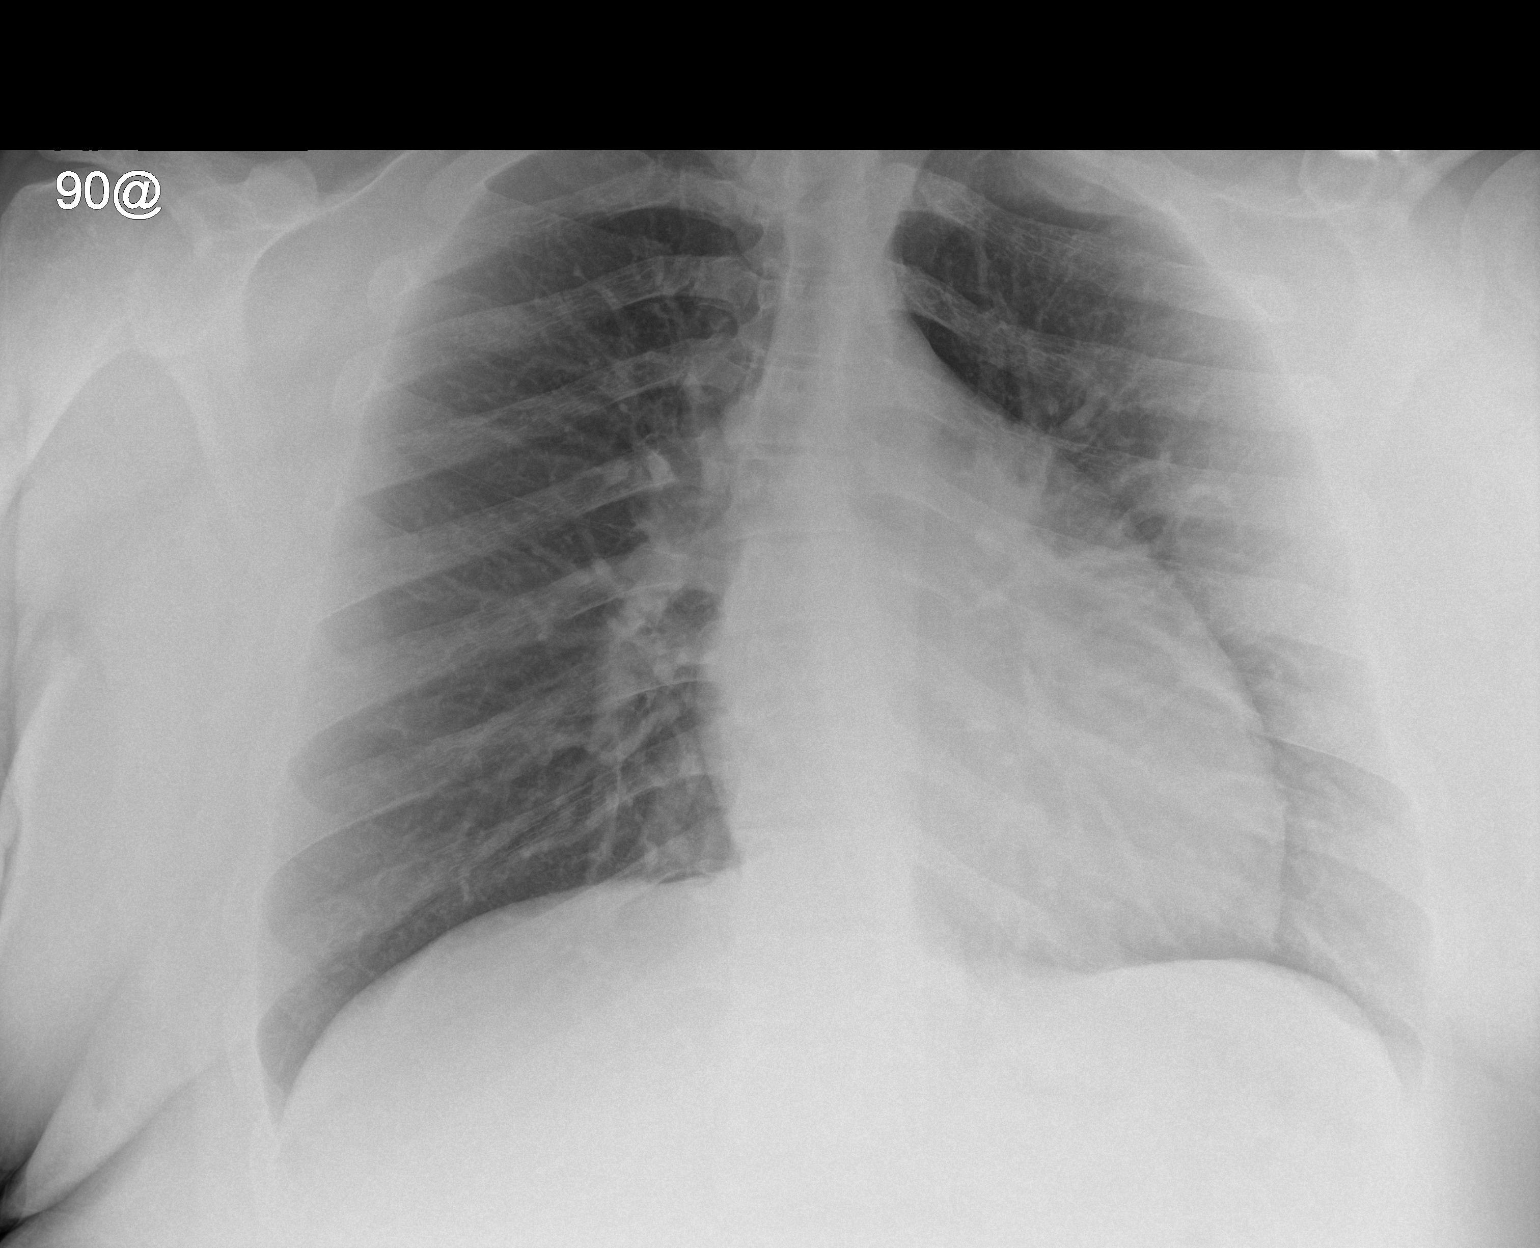

[1 of 1 positions shown; findings below may reference images not displayed]

FINDINGS: The heart size and mediastinal contours are within normal limits.
Both lungs are clear. The visualized skeletal structures are
unremarkable.
IMPRESSION: No active disease.
